# Patient Record
Sex: Female | Born: 1965
Health system: Midwestern US, Community
[De-identification: ages and names within clinical notes are randomized; demographics above are authoritative.]

## PROBLEM LIST (undated history)

## (undated) DIAGNOSIS — I10 Essential (primary) hypertension: Secondary | ICD-10-CM

## (undated) DIAGNOSIS — E119 Type 2 diabetes mellitus without complications: Secondary | ICD-10-CM

## (undated) DIAGNOSIS — M5416 Radiculopathy, lumbar region: Secondary | ICD-10-CM

## (undated) DIAGNOSIS — M797 Fibromyalgia: Secondary | ICD-10-CM

## (undated) HISTORY — DX: Essential (primary) hypertension: I10

## (undated) HISTORY — DX: Type 2 diabetes mellitus without complications: E11.9

---

## 2010-05-04 IMAGING — CR DG CHEST 2V
2 series · 2 of 2 positions shown · non-contrast
Comparison: Report of a prior chest x-ray 04/27/2000

CLINICAL DATA: Cough for

CHEST - 2 VIEW

[w chest pa]
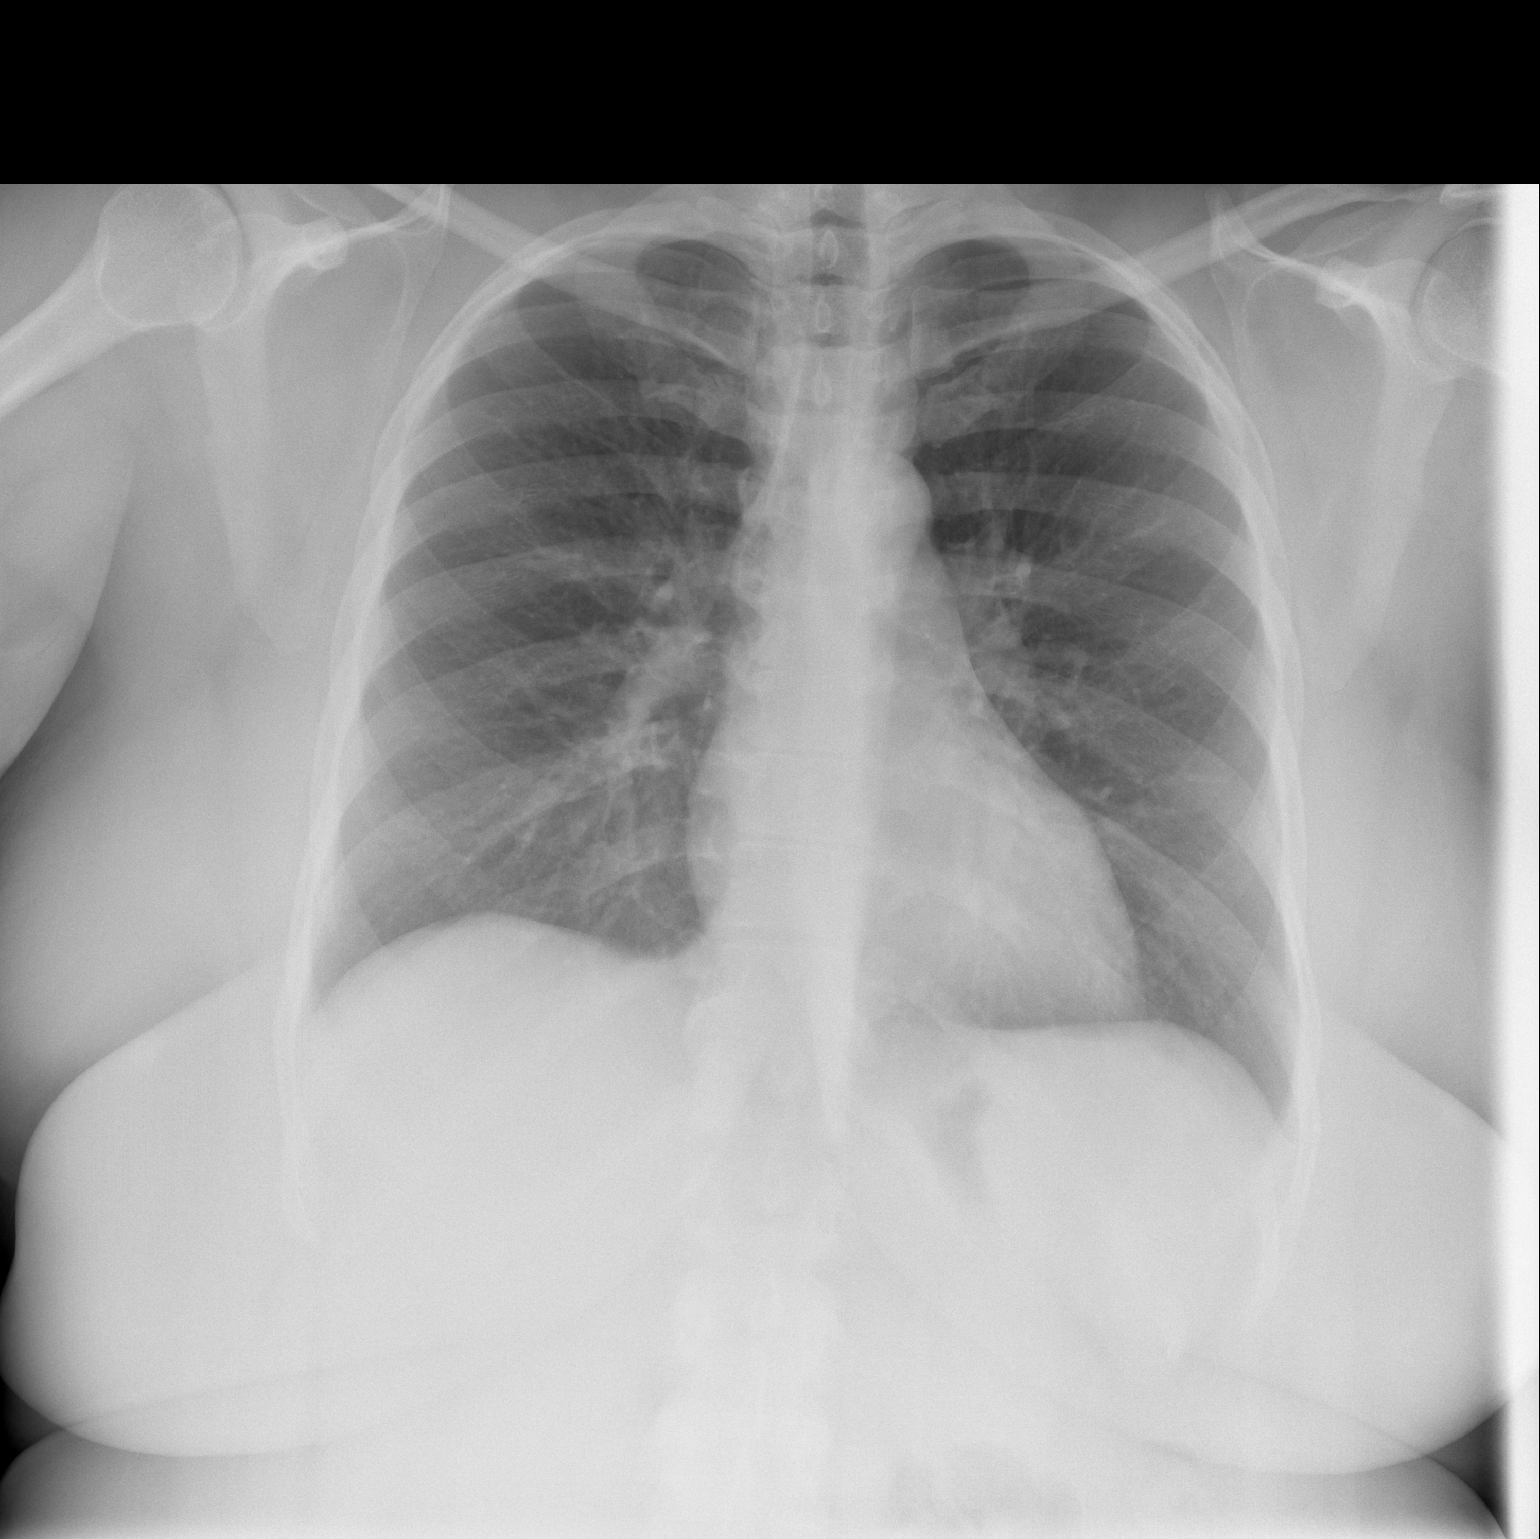

[w chest lat]
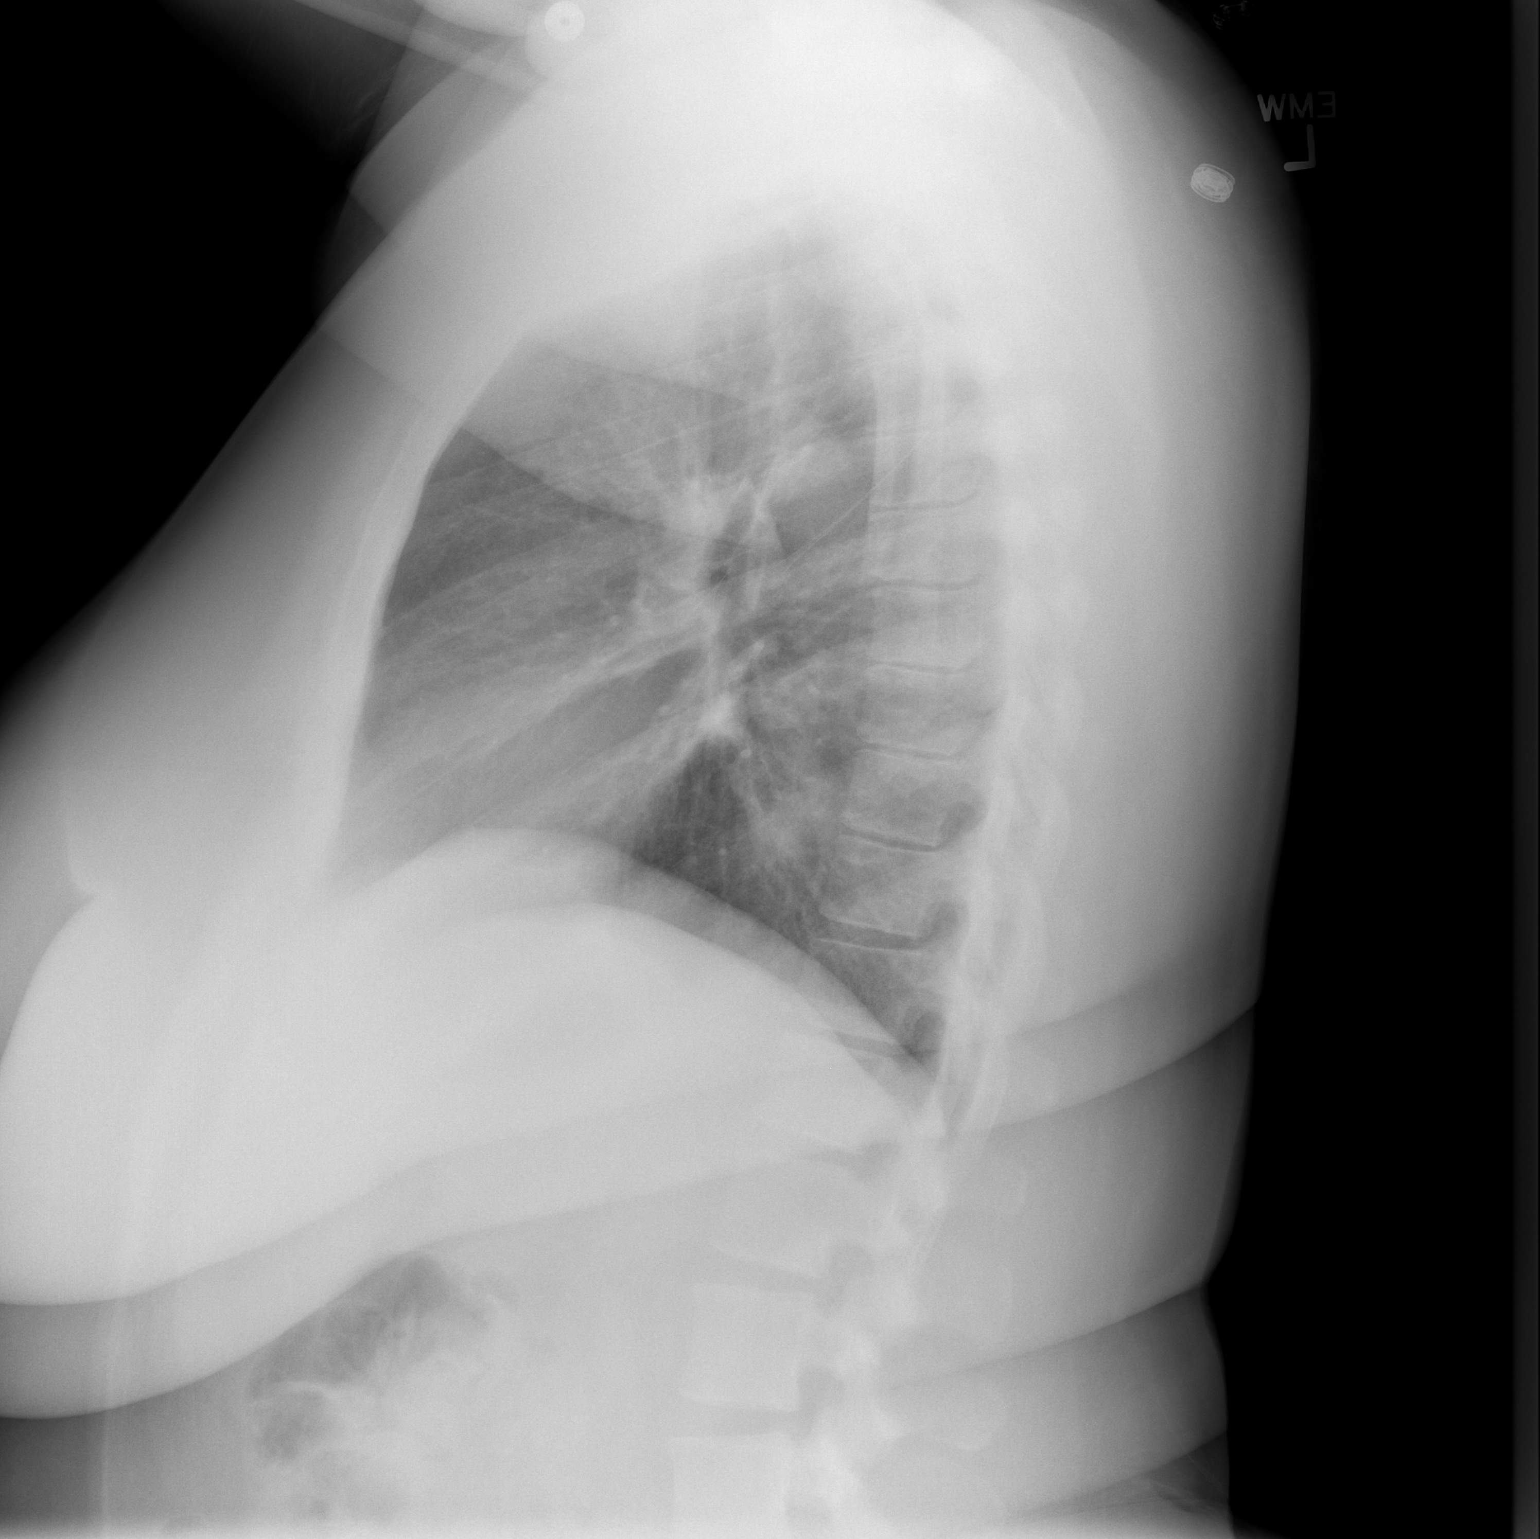

[2 of 2 positions shown; findings below may reference images not displayed]

FINDINGS: Heart and mediastinal contours normal.  Lungs clear.  No
pleural fluid.  Osseous structures and soft tissues unremarkable.
IMPRESSION: No active disease.

## 2018-01-23 ENCOUNTER — Encounter (INDEPENDENT_AMBULATORY_CARE_PROVIDER_SITE_OTHER): Payer: Self-pay | Admitting: Rheumatology

## 2018-01-23 ENCOUNTER — Ambulatory Visit (INDEPENDENT_AMBULATORY_CARE_PROVIDER_SITE_OTHER): Payer: Medicaid Other | Admitting: Rheumatology

## 2018-01-23 VITALS — BP 145/84 | HR 69 | Temp 98.5°F | Resp 15 | Ht 61.0 in | Wt 212.6 lb

## 2018-01-23 DIAGNOSIS — R030 Elevated blood-pressure reading, without diagnosis of hypertension: Secondary | ICD-10-CM

## 2018-01-23 DIAGNOSIS — E1140 Type 2 diabetes mellitus with diabetic neuropathy, unspecified: Secondary | ICD-10-CM

## 2018-01-23 DIAGNOSIS — R768 Other specified abnormal immunological findings in serum: Secondary | ICD-10-CM

## 2018-01-23 DIAGNOSIS — M533 Sacrococcygeal disorders, not elsewhere classified: Secondary | ICD-10-CM

## 2018-01-23 DIAGNOSIS — Z794 Long term (current) use of insulin: Secondary | ICD-10-CM

## 2018-01-23 DIAGNOSIS — M791 Myalgia, unspecified site: Secondary | ICD-10-CM

## 2018-01-23 DIAGNOSIS — G894 Chronic pain syndrome: Secondary | ICD-10-CM

## 2018-01-23 DIAGNOSIS — M1000 Idiopathic gout, unspecified site: Secondary | ICD-10-CM

## 2018-01-23 NOTE — Progress Notes (Signed)
RHEUMATOLOGY NEW PATIENT  NOTE    PCP: Pcp, None, MD    Tiffany Huang is a 52 y.o. female who presents to the rheumatology clinic for initial evaluation of abnl lab results.    Chief Complaint   Patient presents with   . Initial Consult     abn lab results, (R) leg sciatic pain, cortisone shot 12/2017, (L) brusitis,  joint pain, pelvic area, torso and bilateral legs, ambulates upstairs with assistance, sleep disturbance due to pain        +Ana found in the course of work-up of chronic pain. Patient has a 28+ y history of diabetes and has both nerve and possibly eye complications. She has been working with pain mgmt for over 1 year for pain.    Onset - gradual and chronic  Location - bilateral hip(s), bilateral upper leg(s), bilateral ankle(s) and bilateral foot/feet  Duration -  12  months   Character - burning and aching  Severity - 7/10  Alleviating factors - Nothing  Aggravating factors - exercise  Treatments - duloxetine, celebrex, cymbalta    She reports problems with stairs because of weakness.      The following sections were reviewed this encounter by the provider:   Allergies  Meds  Problems  Med Hx  Surg Hx  Fam Hx         PMH/PSH:  Past Medical History:   Diagnosis Date   . Diabetes mellitus    . Hypertension         Past Surgical History:   Procedure Laterality Date   . D & C  1992, 1994   . NASAL SEPTUM SURGERY  2005   . PARTIAL HYSTERECTOMY  01/2002          FH/SH:  Family History   Problem Relation Age of Onset   . Hypertension Mother    . Hypertension Father    . Diabetes Father    . Multiple sclerosis Sister    . Gout Son    . Diabetes Son    . Proteinuria Son    . Sarcoidosis Sister    . Diabetes Sister    . Thrombocytopenia Sister    . Diabetes Sister    . Cancer Sister    . Thyroid disease Sister    . Diabetes Sister    . Polycystic ovary syndrome Sister        Social History     Socioeconomic History   . Marital status: Unknown     Spouse name: Not on file   . Number of children: Not on  file   . Years of education: Not on file   . Highest education level: Not on file   Occupational History   . Not on file   Social Needs   . Financial resource strain: Not on file   . Food insecurity:     Worry: Not on file     Inability: Not on file   . Transportation needs:     Medical: Not on file     Non-medical: Not on file   Tobacco Use   . Smoking status: Never Smoker   . Smokeless tobacco: Never Used   Substance and Sexual Activity   . Alcohol use: Yes     Comment: once a month   . Drug use: Not Currently   . Sexual activity: Not on file   Lifestyle   . Physical activity:     Days per week: Not on  file     Minutes per session: Not on file   . Stress: Not on file   Relationships   . Social connections:     Talks on phone: Not on file     Gets together: Not on file     Attends religious service: Not on file     Active member of club or organization: Not on file     Attends meetings of clubs or organizations: Not on file     Relationship status: Not on file   . Intimate partner violence:     Fear of current or ex partner: Not on file     Emotionally abused: Not on file     Physically abused: Not on file     Forced sexual activity: Not on file   Other Topics Concern   . Not on file   Social History Narrative   . Not on file        Meds/ Allergies:  Outpatient Medications Marked as Taking for the 01/23/18 encounter (Office Visit) with Lawrence Marseilles, MD   Medication Sig Dispense Refill   . amitriptyline (ELAVIL) 25 MG tablet Take 25 mg by mouth nightly     . aspirin 81 MG chewable tablet 1 tablet     . colchicine 0.6 MG tablet TAKE 1 TABLET BY MOUTH ONCE DAILY AS NEEDED FOR 15 DAYS     . escitalopram (LEXAPRO) 20 MG tablet Take 20 mg by mouth daily  2   . indomethacin (INDOCIN) 50 MG capsule TAKE 1 CAPSULE BY MOUTH TWICE DAILY WITH MEALS FOR 15 DAYS     . insulin detemir (LEVEMIR) 100 UNIT/ML injection Inject into the skin     . insulin glargine (LANTUS SOLOSTAR) 100 UNIT/ML injection pen USE AS DIRECTED 10 UNITS  EVERY NIGHT     . Insulin Pen Needle (B-D UF III MINI PEN NEEDLES) 31G X 5 MM Misc USE 1 PEN NEEDLE DAILY     . losartan (COZAAR) 50 MG tablet Take 1 tablet by mouth     . metoprolol succinate (TOPROL-XL) 100 MG 24 hr tablet Take 100 mg by mouth     . pregabalin (LYRICA) 75 MG capsule 1 tab po qAM and 2 tabs qhs     . traZODone (DESYREL) 50 MG tablet Take 50 mg by mouth     . [DISCONTINUED] escitalopram (LEXAPRO) 20 MG tablet        Allergies   Allergen Reactions   . Penicillins        Review of Systems   Constitutional: Positive for malaise/fatigue. Negative for chills, fever and weight loss.   HENT: Negative for hearing loss and tinnitus.    Eyes: Negative for blurred vision and double vision.   Respiratory: Negative for cough and shortness of breath.    Cardiovascular: Negative for chest pain and palpitations.   Gastrointestinal: Negative for heartburn and nausea.   Musculoskeletal: Positive for back pain and myalgias.   Skin: Negative for itching and rash.   Neurological: Positive for tingling and weakness.   Endo/Heme/Allergies: Does not bruise/bleed easily.   Psychiatric/Behavioral: Negative for depression. The patient has insomnia.         PHYSICAL EXAM  Vitals:    01/23/18 1523   BP: 145/84   Pulse: 69   Resp:    Temp:         General- WNWD, alert and oriented, NAD  Eyes- EOMI, PERRL, no conjunctival injection, no scleral icterus  ENMT-ears/nose  symmetric without lesions, OP clear, no oral ulcers  Neck- symmetric, no masses, no thyromegaly  Lymph- no cervical or supraclavicular LAD  CV- nml s1s2 without murmurs, no peripheral edema  Resp- nml effort, CTA bilat, no wheezes or crackles   Skin- no rash, no alopecia, no nail pitting, no tightness of the skin    MSK:   Neck-normal   Shoulders-normal   Elbows-normal  Wrists- normal    Left hand- normal  Right hand-normal     Lumbosacral spine- normal flexion and extension of L Spine. No tenderness. Negative FABER tests.  Hips-normal flexion, internal and external  rotation bilaterally, no trochanteric tenderness. Pain right groin with ROM.  Knees- no crepitus, warmth, swelling or tenderness. Normal ROM.     Strength - shoulders = normal; hips weak hip flexors    LABS:  No results found for: WBC, HGB, HCT, MCV, PLT   No results found for: CREAT, BUN, NA, K, CL, CO2   No results found for: ALT, AST, GGT, ALKPHOS, BILITOTAL   No results found for: ESR   No results found for: CRP   No results found for: ANA   No results found for: RHEUMFACTOR   No results found for: URICACID      RADIOLOGY:    No pertinent radiology results are available for review.      ASSESSMENT/PLAN:    1. Positive ANA (antinuclear antibody)  No specific findings of systemic autoimmune disease that would account for her chronic pain.  - ANA IFA w/rflx to Titer/Pat/Multiplex 11; Future    2. Elevated blood pressure reading  Probable HTN.    3. Pain of right sacroiliac joint  Recent steroid injection. 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    4. Type 2 diabetes mellitus with diabetic neuropathy, with long-term current use of insulin  Contributing factor to chronic pain.    5. Myalgia  Check CK for possibility of myositis. Also could be diabetic muscle disease which commonly strikes the thighs.  - TSH; Future  - Creatine Kinase (CK); Future    6. Chronic pain syndrome  F/up with pain mgmt.    7. Idiopathic gout, unspecified chronicity, unspecified site   SUA 6.1. Continue colchicine.    Return if symptoms worsen or fail to improve.      PATIENT INSTRUCTIONS:  Patient Instructions   Your feedback about the services we provided today in Rheumatology is important to [place].  You will receive a confidential survey via e-mail (check your spam/junk folder) or mail the next Wednesday after today's visit.  Please take a moment to complete it to let us know what we got right and where we can work to improve.    If you haven't already, Please sign up for MyChart.  The messaging feature of MyChart is the best  way to communicate with me and my team to get test results, medication refills, or ask questions.    Here are things I'd like you to do following today's visit:  Please have blood taken for labs to look in more detail about your ANA and the muscle weakness.             Marnee Guarneri, MD, FACP, [facility]  Rheumatologist  [address]  [address]   [place], [place] [address]  P [phone]  F [phone]

## 2018-01-23 NOTE — Patient Instructions (Signed)
Your feedback about the services we provided today in Rheumatology is important to [place].  You will receive a confidential survey via e-mail (check your spam/junk folder) or mail the next Wednesday after today's visit.  Please take a moment to complete it to let us know what we got right and where we can work to improve.    If you haven't already, Please sign up for MyChart.  The messaging feature of MyChart is the best way to communicate with me and my team to get test results, medication refills, or ask questions.    Here are things I'd like you to do following today's visit:  Please have blood taken for labs to look in more detail about your ANA and the muscle weakness.

## 2018-03-13 ENCOUNTER — Encounter (INDEPENDENT_AMBULATORY_CARE_PROVIDER_SITE_OTHER): Payer: Self-pay

## 2018-03-13 ENCOUNTER — Telehealth (INDEPENDENT_AMBULATORY_CARE_PROVIDER_SITE_OTHER): Payer: Self-pay

## 2018-03-13 DIAGNOSIS — R768 Other specified abnormal immunological findings in serum: Secondary | ICD-10-CM

## 2018-03-13 MED ORDER — HYDROXYCHLOROQUINE SULFATE 200 MG PO TABS
200.0000 mg | ORAL_TABLET | Freq: Every day | ORAL | 2 refills | Status: DC
Start: 2018-03-13 — End: 2018-06-07

## 2018-03-13 NOTE — Telephone Encounter (Signed)
Lab results from 03/02/2018 acquired, faxed into epic.

## 2018-03-13 NOTE — Telephone Encounter (Signed)
Called pt back and tried to inform her the lab results. Pt still had questions regarding SSA results. Please call back when you have the chance.    [phone]

## 2018-03-13 NOTE — Telephone Encounter (Signed)
Labs reviewed.  Not concerning of an autoimmune disease.  The ANA is very weakly positive at 1:80.

## 2018-03-13 NOTE — Addendum Note (Signed)
Addended by: Marnee Guarneri on: 03/13/2018 04:47 PM     Modules accepted: Orders

## 2018-03-13 NOTE — Telephone Encounter (Signed)
LVm for pt to CB. RE; lab results

## 2018-03-14 NOTE — Telephone Encounter (Signed)
[facility] order in [place] mail to patient

## 2018-03-22 LAB — IGG, IGA, IGM
IgM: 58 mg/dL (ref 26–217)
Immunoglobulin A: 441 mg/dL — ABNORMAL HIGH (ref 87–352)
Total IgG: 1145 mg/dL (ref 700–1600)

## 2018-03-22 LAB — RHEUMATOID FACTOR: RA Latex Turbid.: 10.3 IU/mL (ref 0.0–13.9)

## 2018-03-22 LAB — C3 COMPLEMENT: C3 Complement: 165 mg/dL (ref 82–167)

## 2018-03-22 LAB — C4 COMPLEMENT: C4 Complement: 47 mg/dL — ABNORMAL HIGH (ref 14–44)

## 2018-03-23 ENCOUNTER — Telehealth (INDEPENDENT_AMBULATORY_CARE_PROVIDER_SITE_OTHER): Payer: Self-pay

## 2018-03-23 NOTE — Telephone Encounter (Signed)
Pt would like Lab results from yesterday  Resulted for your review    Please advise

## 2018-03-24 NOTE — Telephone Encounter (Signed)
Spoke to patient and relayed Lab results; pt did have further questions regarding "high SSA" - advised patient to stay by phone for your call, when you can. She verbalized thanks and understanding.    Thank you    [phone]

## 2018-03-24 NOTE — Telephone Encounter (Signed)
I spoke with pt about her antibodies.  She recalls this being positive 10-20 y ago when she was visiting with a Rheumatologist in [place].  She also recalls a Schirmer test and has been on Sjogren's patient forum and is concerned her symptoms may be related.  I asked her to make a follow-up appointment so we can review and consider additional tests such as a minor salivary gland biopsy.

## 2018-03-24 NOTE — Telephone Encounter (Signed)
I left her a message at [phone]; I asked her to call back.  Summary of lab results  Weak +ANA 1:80, Ro ab 8.0 - seen with Sjogren's syndrome but not diagnostic of this condition.  Complement levels and immunoglobulins are essentially normal.  I don't think these findings account for her chronic MSK pain.  I am happy to discuss with her further.  Thanks  DrK

## 2018-04-11 ENCOUNTER — Ambulatory Visit (INDEPENDENT_AMBULATORY_CARE_PROVIDER_SITE_OTHER): Payer: Medicaid Other | Admitting: Rheumatology

## 2018-04-17 ENCOUNTER — Telehealth (INDEPENDENT_AMBULATORY_CARE_PROVIDER_SITE_OTHER): Payer: Self-pay

## 2018-04-17 NOTE — Telephone Encounter (Signed)
Pt called because she wanted to try low dose Naltrexone. She had heard from her support group and was reading that this could help. Please review    [phone]

## 2018-04-18 NOTE — Telephone Encounter (Signed)
I called the number provided and got pt voicemail.  I left a message; I have no experience with low dose naltrexone.  I recommended she discuss this with her pain mgmt physician/provider.

## 2018-05-10 ENCOUNTER — Ambulatory Visit (INDEPENDENT_AMBULATORY_CARE_PROVIDER_SITE_OTHER): Payer: Medicaid Other | Admitting: Rheumatology

## 2018-05-16 ENCOUNTER — Ambulatory Visit (INDEPENDENT_AMBULATORY_CARE_PROVIDER_SITE_OTHER): Payer: Medicaid Other | Admitting: Rheumatology

## 2018-06-06 ENCOUNTER — Other Ambulatory Visit (INDEPENDENT_AMBULATORY_CARE_PROVIDER_SITE_OTHER): Payer: Self-pay | Admitting: Rheumatology

## 2018-06-06 DIAGNOSIS — R768 Other specified abnormal immunological findings in serum: Secondary | ICD-10-CM

## 2018-10-25 ENCOUNTER — Other Ambulatory Visit (INDEPENDENT_AMBULATORY_CARE_PROVIDER_SITE_OTHER): Payer: Self-pay | Admitting: Rheumatology

## 2018-10-25 DIAGNOSIS — R768 Other specified abnormal immunological findings in serum: Secondary | ICD-10-CM

## 2019-03-08 ENCOUNTER — Encounter

## 2019-03-08 ENCOUNTER — Ambulatory Visit: Admit: 2019-03-08 | Payer: MEDICAID | Attending: Internal Medicine | Primary: Internal Medicine

## 2019-03-08 ENCOUNTER — Ambulatory Visit: Attending: Internal Medicine | Primary: Internal Medicine

## 2019-03-08 DIAGNOSIS — R059 Cough, unspecified: Secondary | ICD-10-CM

## 2019-03-08 DIAGNOSIS — R053 Chronic cough: Secondary | ICD-10-CM

## 2019-03-08 MED ORDER — NALTREXONE HCL (BULK) 100 % POWDER
100 % | Freq: Every day | 3 refills | Status: DC
Start: 2019-03-08 — End: 2019-03-08

## 2019-03-08 MED ORDER — NALTREXONE HCL (BULK) 100 % POWDER
100 % | Freq: Every day | 3 refills | Status: DC
Start: 2019-03-08 — End: 2019-04-13

## 2019-03-08 NOTE — Patient Instructions (Addendum)
1. 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     2. Physical therapy for the low back and thighs.    3. Labs from [facility] at your convenience.    4. Xray from any [facility] location.    5. Start low dose naltrexone 4.5mg  daily from [facility].    6. Talk to your pain doctor about a trial of milnacipran for fibromyalgia pain.    7. Return in 6-8 weeks

## 2019-03-08 NOTE — Progress Notes (Signed)
Chief Complaint   Patient presents with   ??? Fibromyalgia     HANDS, THIGH, ABD   ??? Arthritis

## 2019-03-08 NOTE — Progress Notes (Signed)
REASON FOR VISIT:   Ms.Bastos is a 54 y.o. female with history of  who is self-referred to [place], regarding a diagnosis of joint pain.    HISTORY OF PRESENT ILLNESS    For years, had dry eyes, recalls a Schirmer test being abnormal and told she had antibodies of Sjogren's syndrome. She previously had a positive ANA screen. Saw Dr. Lanny Hurst in December 2019, workup s/f ANA 1:80, SSA+ 8.0, negative rheumatoid factor. Had punctal plugs placed couple months ago which have helped. Visine hasn't been helpful, started another OTC lubricant which been more helpful. In the 1990's she wnet to the ophthalmologist while in [place], diagnosed with infection related to dry eyes. Hasn't used any prescription medications.    Frequent dry mouth. Cold sores but no oral ulcers. Hasn't been to the dentist since 2018, but no current oral discomfort.    Had one episode of Bell's palsy maybe on the left, no brain imaging at the time and resolved spontaneously.    Has been out of work since 2018 2/2 chronic pain.    Right low back (?SI) injection helped for sciatica, but now the left side is is weak and painful, has to manually lift her left thigh .      Left lateral thigh is worst. Feels she is being stuck with a needle in various parts of the body, sharp pains on the torso, under the foot, etc.    Takes ibuprofen $RemoveBeforeD'800mg'[id number]$  and tylenol, when severe takes $RemoveBefo'1600mg'xjeSQKGyvQM$ . Celebrex made her more sluggish, concerned for the risk of weight gain with that.    Postprandial bloating, frequent GI upset. Tends toward constipation. No h/o dark or bloody stools. Taking a lot of gaviscon and alka seltzer for dyspepsia.    Getting pruritis, "goose bumps," improved after a steroid taper though spiked blood sugar. Advised to avoid hot showers.     Sleep is poor. Goes to bed at [place], wakes at [place]. Drenching night sweats about once a week.     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 A1c she thinks over a year ago was in the 9's, from 13 in the past. Painful numbness distal to ankles bilaterally which keeps her up with layig down at night. Lyrica was helping but she was concerned with withdrawal side effects she experienced when she reduced the dose so took herself off of this.      Continues to deal with hypertension despite 2 drugs, cardiology visit is pending later this month.    No photodistributed rashes.    No oral ulcers. She has recently started to avoid gluten with some improvement in diabetes and GI upset.     She has a mild cough she attributes to longstanding sinus congestion, particularly bothersome at night. Used to be seasonal and limited to Fall, but has persisted this year.    REVIEW OF SYSTEMS  A comprehensive review of systems was negative except for that written in the HPI.  A 10-point review of systems is per the new patient questionnaire, which has been reviewed extensively and scanned into the electronic medical record for future reference.  Review of systems is as above and is otherwise negative.    ALLERGIES  Penicillins    MEDICATIONS  Current Outpatient Medications   Medication Sig   ??? metoprolol tartrate (LOPRESSOR) 100 mg IR tablet 100 mg daily.   ??? losartan (COZAAR) 50 mg tablet Take 50 mg by mouth daily.   ??? insulin glargine (Lantus  U-100 Insulin) 100 unit/mL injection by SubCUTAneous route nightly.   ??? semaglutide (OZEMPIC SC) by SubCUTAneous route.   ??? aspirin (ASPIRIN) 325 mg tablet Take 325 mg by mouth daily.   ??? venlafaxine-SR (Effexor XR) 150 mg capsule Take 150 mg by mouth daily.   ??? rosuvastatin (Crestor) 10 mg tablet Take 10 mg by mouth nightly.     No current facility-administered medications for this visit.        PAST MEDICAL HISTORY  Past Medical History:   Diagnosis Date   ??? Diabetes ([place])        FAMILY HISTORY   family history includes MS in her sister. 4 sisters and 1 brother. Sister has sarcoidosis and thrombocytopenia. Another sister with MS and oral ulcers.    SOCIAL HISTORY  She  reports that she has never smoked. She has never used smokeless tobacco. She reports current alcohol use. She reports current drug use. Drug: Marijuana.  Social History     Social History Narrative   ??? Not on file       DATA  Visit Vitals  BP (!) 167/90 (BP 1 Location: Left arm, BP Patient Position: Sitting)   Pulse 83   Temp 97.5 ??F (36.4 ??C) (Oral)   Resp 20   Ht '5\' 2"'$  (1.575 m)   Wt 233 lb 9.6 oz (106 kg)   SpO2 96%   BMI 42.73 kg/m??    Body mass index is 42.73 kg/m??. No flowsheet data found.    General:  The patient is pleasant, morbidly obese, alert, and in no apparent distress.   Eyes: Sclera are anicteric. Decreased tear meniscus bilaterally with trace conjunctival injection.   HEENT:  Oropharynx is clear. No oral ulcers. Decreased salivary pooling without glossitis or flissuring. No cervical or supraclavicular lymphadenopathy.   Lungs:  Clear to auscultation bilaterally, without wheeze or stridor. Normal respiratory effort.   Cor:  Regular rate and rhythm.  No murmur rub or gallop.   Abdomen: Soft, non-tender, without hepatomegaly or masses.   Extremities: No calf tenderness or edema. Warm and well perfused.   Skin:  No significant abnormalities.   Neuro: +Right-sided SLR raise, no muscle wasting.   Musculoskeletal:    A comprehensive musculoskeletal exam was performed for all joints of each upper and lower extremity and assessed for swelling, tenderness and range of motion. Results are documented as below:  Pan-positive fibromyalgia tenderpoints.  Negative steeple sign in hands  Marked tenderness over pes anserine bursa in left knee.  No evidence of synovitis in the small joints of the hands, wrists, shoulders, elbows, hips, knees or ankles.     Labs:  03/21/18 RF neg; IgA 441 (high), IgG 1145, C3 WNL, C4 high   03/02/18: CMP WNL, CBC WNL, ANA IFA 1:80 speckled, SSA >8.0, SSB neg, negative centromere B, RNP, chromatin, dsDNA, Sm antibodies  12/01/17: CMP WNL, CBC WNL    Imaging:  09/04/18 (report only) XR L hand, L foot: No acute fracture or dislocation  06/08/18 XR Lspine (report only): Well-maintained disc heights. ??No evidence of any acute pathology. ??AP and   lateral alignments are normal.    ASSESSMENT AND PLAN  Ms. Sahlin is a 54 y.o. female who presents for evaluation of mixed-character joint pain and fatigue on a background of SSA+ sicca complex consistent with Sjogren's syndrome, fibromyalgia, and sciatica.    1. Sjogren's syndrome with other organ involvement ([place])  - XR CHEST PA LAT; Future  - REFERRAL TO PHYSICAL THERAPY  -  VITAMIN B12; Future  - SPEP AND IFE, SERUM; Future  - C REACTIVE PROTEIN, QT; Future  - CBC WITH AUTOMATED DIFF; Future  - METABOLIC PANEL, COMPREHENSIVE; Future  - SED RATE (ESR); Future  - Naltrexone HCL, Bulk, 100 % powd; 4.5 mg by Does Not Apply route daily.  Dispense: 0.135 g; Refill: 3    2. Chronic cough  - XR CHEST PA LAT; Future    3. Fibromyalgia  - REFERRAL TO PHYSICAL THERAPY  - Naltrexone HCL, Bulk, 100 % powd; 4.5 mg by Does Not Apply route daily.  Dispense: 0.135 g; Refill: 3   -Fibromyalgia is a disease characterized by chronic widespread musculoskeletal pain. Fibromyalgia is caused by abnormal processing of pain signals in the central nervous system, leading to exaggerated pain responses. Non-pharmacologic therapies such as cardiovascular exercise and Cognitive Behavioral Therapy have been shown to be of benefit ([place], Am J Med 2009). Tai Chi in particular has proven efficacy in the treatment of fibromyalgia Talbert Cage, NEJM 2010).  If pharmacotherapy is pursued, pregabalin (Lyrica), gabapentin (Neurontin), milnacipran (Savella), and duloxetine (Cymbalta) are FDA approved medications for the treatment of fibromyalgia. Narcotics have not been proven to be efficacious in the treatment of fibromyalgia. In fact, narcotic use in this patient population has been observed to exacerbate depression, and may enhance the hyperalgesia which is characteristic of this condition Roselie Awkward, Arth Rheum 2006). They also are at increased risk for opioid-induced hyperalgesia due predominantly to central sensitization Sharyon Cable JT et al. Lenna Sciara Clin Rheumatol. 2013 Mar;19(2):72-7). Specifically, a double-blind placebo-controlled trial by Annie Sable al published in 1995 demonstrated that intravenous morphine did not reduce pain in fibromyalgia patients. A study by Ivy Lynn al published in 2003 showed that fibromyalgia patients taking oral opiates did not experience improvement in their pain at four years of follow up, and also reported increased depression over the last two years of the study. There is subsequent concern that the prolonged use of narcotics to treat fibromyalgia may cause harm to these patients Edison Pace, Pain 2005). Opioid use in fibromyalgia had poorer symptoms and functional and occupational status compared to nonusers (Fitzcharles MA et al. Pain Res Treat. 3875;6433:295188).  We therefore recommend that narcotics be avoided in all patients with fibromyalgia. Furthermore, naltrexone 4.'5mg'$  daily has been  shown to improve daily pain scores in fibromyalgia in a randomized, controlled clinical trial (Arthritis Rheum. 2013 Feb;65(2):529-38); this can be prescribed through a compounding pharmacy and is a consideration for patients not already dependent on opiate-type medications. For all patients, we recommend Tai Chi stretching exercises for at least 30 minutes per day. The Arthritis Foundation has made a videotape of [place] that she can borrow from [employer], purchase online or watch for free on [email or web address] ???Tai Chi for Arthritis???.   We discussed treating secondary causes, such as sleep apnea, poor sleep quality, depression, anxiety, weight loss, vitamin deficiencies, such as vitamin D, and pursuing aquatherapy. I encouraged her to do [place].   My recommendations were provided to her and she was informed to follow up with her primary care physician for further management of her fibromyalgia.     4. Diabetic polyneuropathy associated with type 2 diabetes mellitus ([place])  - VITAMIN B12; Future    5. Meralgia paresthetica of left side  - REFERRAL TO PHYSICAL THERAPY  - XR HIPS BI W PELV 3 OR 4 VWS; Future    6. Pes anserine bursitis  - REFERRAL TO PHYSICAL THERAPY    7. Primary osteoarthritis of both  hips  - XR HIPS BI W PELV 3 OR 4 VWS; Future    8. Lumbar facet arthropathy  - XR SPINE LUMB MIN 4 V; Future        Orders Placed This Encounter   ??? metoprolol tartrate (LOPRESSOR) 100 mg IR tablet   ??? losartan (COZAAR) 50 mg tablet   ??? insulin glargine (Lantus U-100 Insulin) 100 unit/mL injection   ??? semaglutide (OZEMPIC SC)   ??? aspirin (ASPIRIN) 325 mg tablet   ??? venlafaxine-SR (Effexor XR) 150 mg capsule   ??? rosuvastatin (Crestor) 10 mg tablet     Patient Instructions   1. 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     2. Physical therapy for the low back and thighs.    3. Labs from [place] at your convenience.     4. Xray from any [facility] location.    5. Start low dose naltrexone 4.'5mg'$  daily from [facility].    6. Talk to your pain doctor about a trial of milnacipran for fibromyalgia pain.    7. Return in 6-8 weeks      Medications: I am having Yatziry Deakins maintain her metoprolol tartrate, losartan, insulin glargine, semaglutide (OZEMPIC SC), aspirin, venlafaxine-SR, rosuvastatin, and Naltrexone HCL (Bulk).    Follow up: 6-8 weeks  Burton Apley, MD    Adult Rheumatology   [facility]  A Part of [facility]  [address] [place] [address]   Phone [phone]  Fax [phone]

## 2019-03-08 NOTE — Progress Notes (Signed)
REASON FOR VISIT:   Ms.Amaker is a 54 y.o. female with history of  who is self-referred to [place], regarding a diagnosis of joint pain.    HISTORY OF PRESENT ILLNESS    For years, had dry eyes, recalls a Schirmer test being abnormal and told she had antibodies of Sjogren's syndrome. She previously had a positive ANA screen. Saw Dr. Lanny Hurst in December 2019, workup s/f ANA 1:80, SSA+ 8.0, negative rheumatoid factor. Had punctal plugs placed couple months ago which have helped. Visine hasn't been helpful, started another OTC lubricant which been more helpful. In the 1990's she wnet to the ophthalmologist while in [place], diagnosed with infection related to dry eyes. Hasn't used any prescription medications.    Frequent dry mouth. Cold sores but no oral ulcers. Hasn't been to the dentist since 2018, but no current oral discomfort.    Had one episode of Bell's palsy maybe on the left, no brain imaging at the time and resolved spontaneously.    Has been out of work since 2018 2/2 chronic pain.    Right low back (?SI) injection helped for sciatica, but now the left side is is weak and painful, has to manually lift her left thigh .      Left lateral thigh is worst. Feels she is being stuck with a needle in various parts of the body, sharp pains on the torso, under the foot, etc.    Takes ibuprofen 884m and tylenol, when severe takes 16046m Celebrex made her more sluggish, concerned for the risk of weight gain with that.    Postprandial bloating, frequent GI upset. Tends toward constipation. No h/o dark or bloody stools. Taking a lot of gaviscon and alka seltzer for dyspepsia.    Getting pruritis, "goose bumps," improved after a steroid taper though spiked blood sugar. Advised to avoid hot showers.     Sleep is poor. [place] to bed at [place] night sweats about once a week.    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 A1c she thinks over a  year ago was in the 9's, from 13 in the past. Painful numbness distal to ankles bilaterally which keeps her up with layig down at night. Lyrica was helping but she was concerned with withdrawal side effects she experienced when she reduced the dose so took herself off of this.      Continues to deal with hypertension despite 2 drugs, cardiology visit is pending later this month.    No photodistributed rashes.    No oral ulcers. She has recently started to avoid gluten with some improvement in diabetes and GI upset.     She has a mild cough she attributes to longstanding sinus congestion, particularly bothersome at night. Used to be seasonal and limited to Fall, but has persisted this year.    REVIEW OF SYSTEMS  A comprehensive review of systems was negative except for that written in the HPI.  A 10-point review of systems is per the new patient questionnaire, which has been reviewed extensively and scanned into the electronic medical record for future reference.  Review of systems is as above and is otherwise negative.    ALLERGIES  Penicillins    MEDICATIONS  Current Outpatient Medications   Medication Sig   ??? metoprolol tartrate (LOPRESSOR) 100 mg IR tablet 100 mg daily.   ??? losartan (COZAAR) 50 mg tablet Take 50 mg by mouth daily.   ??? insulin glargine (Lantus  U-100 Insulin) 100 unit/mL injection by SubCUTAneous route nightly.   ??? semaglutide (OZEMPIC SC) by SubCUTAneous route.   ??? aspirin (ASPIRIN) 325 mg tablet Take 325 mg by mouth daily.   ??? venlafaxine-SR (Effexor XR) 150 mg capsule Take 150 mg by mouth daily.   ??? rosuvastatin (Crestor) 10 mg tablet Take 10 mg by mouth nightly.     No current facility-administered medications for this visit.        PAST MEDICAL HISTORY  Past Medical History:   Diagnosis Date   ??? Diabetes ([place])        FAMILY HISTORY  family history includes MS in her sister. 4 sisters and 1 brother. Sister has sarcoidosis and thrombocytopenia. Another sister with MS and oral ulcers.    SOCIAL  HISTORY  She  reports that she has never smoked. She has never used smokeless tobacco. She reports current alcohol use. She reports current drug use. Drug: Marijuana.  Social History     Social History Narrative   ??? Not on file       DATA  Visit Vitals  BP (!) 167/90 (BP 1 Location: Left arm, BP Patient Position: Sitting)   Pulse 83   Temp 97.5 ??F (36.4 ??C) (Oral)   Resp 20   Ht 5' 2"  (1.575 m)   Wt 233 lb 9.6 oz (106 kg)   SpO2 96%   BMI 42.73 kg/m??    Body mass index is 42.73 kg/m??. No flowsheet data found.    General:  The patient is pleasant, morbidly obese, alert, and in no apparent distress.   Eyes: Sclera are anicteric. Decreased tear meniscus bilaterally with trace conjunctival injection.   HEENT:  Oropharynx is clear. No oral ulcers. Decreased salivary pooling without glossitis or flissuring. No cervical or supraclavicular lymphadenopathy.   Lungs:  Clear to auscultation bilaterally, without wheeze or stridor. Normal respiratory effort.   Cor:  Regular rate and rhythm.  No murmur rub or gallop.   Abdomen: Soft, non-tender, without hepatomegaly or masses.   Extremities: No calf tenderness or edema. Warm and well perfused.   Skin:  No significant abnormalities.   Neuro: +Right-sided SLR raise, no muscle wasting.   Musculoskeletal:    A comprehensive musculoskeletal exam was performed for all joints of each upper and lower extremity and assessed for swelling, tenderness and range of motion. Results are documented as below:  Pan-positive fibromyalgia tenderpoints.  Negative steeple sign in hands  Marked tenderness over pes anserine bursa in left knee.  No evidence of synovitis in the small joints of the hands, wrists, shoulders, elbows, hips, knees or ankles.     Labs:  03/21/18 RF neg; IgA 441 (high), IgG 1145, C3 WNL, C4 high  03/02/18: CMP WNL, CBC WNL, ANA IFA 1:80 speckled, SSA >8.0, SSB neg, negative centromere B, RNP, chromatin, dsDNA, Sm antibodies  12/01/17: CMP WNL, CBC WNL    Imaging:  09/04/18 (report  only) XR L hand, L foot: No acute fracture or dislocation  06/08/18 XR Lspine (report only): Well-maintained disc heights. ??No evidence of any acute pathology. ??AP and   lateral alignments are normal.    ASSESSMENT AND PLAN  Ms. Halls is a 54 y.o. female who presents for evaluation of mixed-character joint pain and fatigue on a background of SSA+ sicca complex consistent with Sjogren's syndrome, fibromyalgia, and sciatica.    1. Sjogren's syndrome with other organ involvement ([place])  - XR CHEST PA LAT; Future  - REFERRAL TO PHYSICAL THERAPY  -  VITAMIN B12; Future  - SPEP AND IFE, SERUM; Future  - C REACTIVE PROTEIN, QT; Future  - CBC WITH AUTOMATED DIFF; Future  - METABOLIC PANEL, COMPREHENSIVE; Future  - SED RATE (ESR); Future  - Naltrexone HCL, Bulk, 100 % powd; 4.5 mg by Does Not Apply route daily.  Dispense: 0.135 g; Refill: 3    2. Chronic cough  - XR CHEST PA LAT; Future    3. Fibromyalgia  - REFERRAL TO PHYSICAL THERAPY  - Naltrexone HCL, Bulk, 100 % powd; 4.5 mg by Does Not Apply route daily.  Dispense: 0.135 g; Refill: 3  -Fibromyalgia is a disease characterized by chronic widespread musculoskeletal pain. Fibromyalgia is caused by abnormal processing of pain signals in the central nervous system, leading to exaggerated pain responses. Non-pharmacologic therapies such as cardiovascular exercise and Cognitive Behavioral Therapy have been shown to be of benefit ([place], Am J Med 2009). Tai Chi in particular has proven efficacy in the treatment of fibromyalgia Talbert Cage, NEJM 2010).  If pharmacotherapy is pursued, pregabalin (Lyrica), gabapentin (Neurontin), milnacipran (Savella), and duloxetine (Cymbalta) are FDA approved medications for the treatment of fibromyalgia. Narcotics have not been proven to be efficacious in the treatment of fibromyalgia. In fact, narcotic use in this patient population has been observed to exacerbate depression, and may enhance the hyperalgesia which is characteristic of this condition  Roselie Awkward, Arth Rheum 2006). They also are at increased risk for opioid-induced hyperalgesia due predominantly to central sensitization Sharyon Cable JT et al. Lenna Sciara Clin Rheumatol. 2013 Mar;19(2):72-7). Specifically, a double-blind placebo-controlled trial by Annie Sable al published in 1995 demonstrated that intravenous morphine did not reduce pain in fibromyalgia patients. A study by Ivy Lynn al published in 2003 showed that fibromyalgia patients taking oral opiates did not experience improvement in their pain at four years of follow up, and also reported increased depression over the last two years of the study. There is subsequent concern that the prolonged use of narcotics to treat fibromyalgia may cause harm to these patients Edison Pace, Pain 2005). Opioid use in fibromyalgia had poorer symptoms and functional and occupational status compared to nonusers (Fitzcharles MA et al. Pain Res Treat. 1610;9604:540981).  We therefore recommend that narcotics be avoided in all patients with fibromyalgia. Furthermore, naltrexone 4.56m daily has been shown to improve daily pain scores in fibromyalgia in a randomized, controlled clinical trial (Arthritis Rheum. 2013 Feb;65(2):529-38); this can be prescribed through a compounding pharmacy and is a consideration for patients not already dependent on opiate-type medications. For all patients, we recommend Tai Chi stretching exercises for at least 30 minutes per day. The Arthritis Foundation has made a videotape of TSigurdthat she can borrow from [employer] purchase online or watch for free on [email or web address]???Tai Chi for Arthritis???.   We discussed treating secondary causes, such as sleep apnea, poor sleep quality, depression, anxiety, weight loss, vitamin deficiencies, such as vitamin D, and pursuing aquatherapy. I encouraged [place]   My recommendations were provided to her and she was informed to follow up with her primary care physician for further management of her  fibromyalgia.     4. Diabetic polyneuropathy associated with type 2 diabetes mellitus ([place]  - VITAMIN B12; Future    5. Meralgia paresthetica of left side  - REFERRAL TO PHYSICAL THERAPY  - XR HIPS BI W PELV 3 OR 4 VWS; Future    6. Pes anserine bursitis  - REFERRAL TO PHYSICAL THERAPY    7. Primary osteoarthritis of both  hips  - XR HIPS BI W PELV 3 OR 4 VWS; Future    8. Lumbar facet arthropathy  - XR SPINE LUMB MIN 4 V; Future        Orders Placed This Encounter   ??? metoprolol tartrate (LOPRESSOR) 100 mg IR tablet   ??? losartan (COZAAR) 50 mg tablet   ??? insulin glargine (Lantus U-100 Insulin) 100 unit/mL injection   ??? semaglutide (OZEMPIC SC)   ??? aspirin (ASPIRIN) 325 mg tablet   ??? venlafaxine-SR (Effexor XR) 150 mg capsule   ??? rosuvastatin (Crestor) 10 mg tablet     Patient Instructions   1. 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     2. Physical therapy for the low back and thighs.    3. Labs from [place] at your convenience.    4. Xray from any [facility] location.    5. Start low dose naltrexone 4.7m daily from [facility].    6. Talk to your pain doctor about a trial of milnacipran for fibromyalgia pain.    7. Return in 6-8 weeks      Medications: I am having KHarsimran Westmanmaintain her metoprolol tartrate, losartan, insulin glargine, semaglutide (OZEMPIC SC), aspirin, venlafaxine-SR, rosuvastatin, and Naltrexone HCL (Bulk).    Follow up: 6-8 weeks  CBurton Apley MD    Adult Rheumatology   [facility] [address] [place] [address]  Phone [phone] Fax [phone]

## 2019-03-08 NOTE — Progress Notes (Signed)
 Chief Complaint   Patient presents with   . Fibromyalgia     HANDS, THIGH, ABD   . Arthritis

## 2019-04-10 NOTE — Telephone Encounter (Signed)
Patient is calling due to the Naltrexone is not covered under her insurance for the Compound Pharm[facility]. She will need this not in a compound form, but a regular RX  sent to [facility], P: [phone], on  [address], [place], [place],. Her phone is [phone].

## 2019-04-11 NOTE — Telephone Encounter (Signed)
Used 2 identifiers. Patient given Dr. Cooper Render message regarding Naltrexone. Patient understood, and will look into another Compounding Pharmacy.

## 2019-04-11 NOTE — Telephone Encounter (Signed)
This is a medication used for fibromyalgia. I always warn patients that insurance will not cover it, though it's usually not more than $15-20 per month out of pocket. The compounding pharmacy is necessary because the dose is usually 80mg , and instead patients take 4.5mg  daily for chronic pain conditions.   I had warned the patient insurance wouldn't cover it. If it's not affordable for her, she will either need to find a more affordable compounding pharmacy or go with another treatment option for fibromyalgia through her PCP.    Message text

## 2019-04-13 ENCOUNTER — Encounter

## 2019-04-13 MED ORDER — NALTREXONE HCL (BULK) 100 % POWDER
100 % | Freq: Every day | 3 refills | Status: AC
Start: 2019-04-13 — End: ?

## 2019-04-13 NOTE — Telephone Encounter (Signed)
-----   Message from Lennox Pippins sent at 04/13/2019  3:09 PM EST -----  Regarding: Dr Nyra Capes  Pt is calling regarding Low dose Naltrexone, said the pharmacy that it was call to cost $50 for 30,day supply,  she found a pharmacy on line for $46.95 for a 90 day supply, so she would like to get it call into the [facility] at [facility] [phone], fax number [phone], pt can be reach at [phone]

## 2019-04-16 LAB — CBC WITH AUTO DIFFERENTIAL
Basophils %: 0 %
Basophils Absolute: 0 10*3/uL (ref 0.0–0.2)
Eosinophils %: 2 %
Eosinophils Absolute: 0.1 10*3/uL (ref 0.0–0.4)
Granulocyte Absolute Count: 0 10*3/uL (ref 0.0–0.1)
Hematocrit: 42 % (ref 34.0–46.6)
Hemoglobin: 13.5 g/dL (ref 11.1–15.9)
Immature Granulocytes %: 0 %
Lymphocytes %: 35 %
Lymphocytes Absolute: 1.8 10*3/uL (ref 0.7–3.1)
MCH: 27.5 pg (ref 26.6–33.0)
MCHC: 32.1 g/dL (ref 31.5–35.7)
MCV: 86 fL (ref 79–97)
Monocytes %: 7 %
Monocytes Absolute: 0.4 10*3/uL (ref 0.1–0.9)
Neutrophils %: 56 %
Neutrophils Absolute: 2.9 10*3/uL (ref 1.4–7.0)
Platelets: 370 10*3/uL (ref 150–450)
RBC: 4.91 x10E6/uL (ref 3.77–5.28)
RDW: 14.2 % (ref 11.7–15.4)
WBC: 5.1 10*3/uL (ref 3.4–10.8)

## 2019-04-16 LAB — COMPREHENSIVE METABOLIC PANEL
ALT: 23 IU/L (ref 0–32)
AST: 25 IU/L (ref 0–40)
Albumin/Globulin Ratio: 1.2 NA (ref 1.2–2.2)
Albumin: 3.8 g/dL (ref 3.8–4.9)
Alkaline Phosphatase: 103 IU/L (ref 39–117)
BUN/Creatinine Ratio: 17 NA (ref 9–23)
BUN: 21 mg/dL (ref 6–24)
CO2: 26 mmol/L (ref 20–29)
Calcium: 9.5 mg/dL (ref 8.7–10.2)
Chloride: 99 mmol/L (ref 96–106)
Creatinine: 1.25 mg/dL — ABNORMAL HIGH (ref 0.57–1.00)
GFR African American: 56 mL/min/{1.73_m2} — ABNORMAL LOW (ref >59)
Globulin, Total: 3.2 g/dL (ref 1.5–4.5)
Glucose: 126 mg/dL — ABNORMAL HIGH (ref 65–99)
Potassium: 4.9 mmol/L (ref 3.5–5.2)
Sodium: 140 mmol/L (ref 134–144)
Total Bilirubin: 0.4 mg/dL (ref 0.0–1.2)
Total Protein: 7 g/dL (ref 6.0–8.5)
eGFR NON-AA: 49 mL/min/{1.73_m2} — ABNORMAL LOW (ref >59)

## 2019-04-16 LAB — SPEP AND IFE, SERUM
A/G RATIO, 149531: 1 NA (ref 0.7–1.7)
A/G ratio: 1 (ref 0.7–1.7)
Albumin: 3.4 g/dL (ref 2.9–4.4)
Albumin: 3.4 g/dL (ref 2.9–4.4)
Alpha-1-Globulin: 0.2 g/dL (ref 0.0–0.4)
Alpha-1-globulin: 0.2 g/dL (ref 0.0–0.4)
Alpha-2-Globulin: 0.9 g/dL (ref 0.4–1.0)
Alpha-2-Globulin: 0.9 g/dL (ref 0.4–1.0)
Beta Globulin: 1.5 g/dL — ABNORMAL HIGH (ref 0.7–1.3)
Beta Globulin: 1.5 g/dL — ABNORMAL HIGH (ref 0.7–1.3)
Gamma Globulin: 1 g/dL (ref 0.4–1.8)
Gamma Globulin: 1 g/dL (ref 0.4–1.8)
Globulin, Total: 3.6 g/dL (ref 2.2–3.9)
Globulin, total: 3.6 g/dL (ref 2.2–3.9)
Immunoglobulin A, Qt.: 441 mg/dL — ABNORMAL HIGH (ref 87–352)
Immunoglobulin A, Qt.: 441 mg/dL — ABNORMAL HIGH (ref 87–352)
Immunoglobulin G, Qt.: 1203 mg/dL (ref 586–1602)
Immunoglobulin G, Quantitative: 1203 mg/dL (ref 586–1602)
Immunoglobulin M, Qt.: 57 mg/dL (ref 26–217)
Immunoglobulin M, Quantitative: 57 mg/dL (ref 26–217)

## 2019-04-16 LAB — VITAMIN B12
Vitamin B-12: 935 pg/mL (ref 232–1245)
Vitamin B12: 935 pg/mL (ref 232–1245)

## 2019-04-16 LAB — C-REACTIVE PROTEIN: CRP: 6 mg/L (ref 0–10)

## 2019-04-16 LAB — SEDIMENTATION RATE: Sed Rate, Automated: 16 mm/h (ref 0–40)

## 2019-04-16 LAB — METABOLIC PANEL, COMPREHENSIVE
A-G Ratio: 1.2 (ref 1.2–2.2)
ALT (SGPT): 23 IU/L (ref 0–32)
AST (SGOT): 25 IU/L (ref 0–40)
Albumin: 3.8 g/dL (ref 3.8–4.9)
Alk. phosphatase: 103 IU/L (ref 39–117)
BUN/Creatinine ratio: 17 (ref 9–23)
BUN: 21 mg/dL (ref 6–24)
Bilirubin, total: 0.4 mg/dL (ref 0.0–1.2)
CO2: 26 mmol/L (ref 20–29)
Calcium: 9.5 mg/dL (ref 8.7–10.2)
Chloride: 99 mmol/L (ref 96–106)
Creatinine: 1.25 mg/dL — ABNORMAL HIGH (ref 0.57–1.00)
GFR est AA: 56 mL/min/{1.73_m2} — ABNORMAL LOW (ref >59)
GFR est non-AA: 49 mL/min/{1.73_m2} — ABNORMAL LOW (ref >59)
GLOBULIN, TOTAL: 3.2 g/dL (ref 1.5–4.5)
Glucose: 126 mg/dL — ABNORMAL HIGH (ref 65–99)
Potassium: 4.9 mmol/L (ref 3.5–5.2)
Protein, total: 7 g/dL (ref 6.0–8.5)
Sodium: 140 mmol/L (ref 134–144)

## 2019-04-16 LAB — CBC WITH AUTOMATED DIFF
ABS. BASOPHILS: 0 10*3/uL (ref 0.0–0.2)
ABS. EOSINOPHILS: 0.1 10*3/uL (ref 0.0–0.4)
ABS. IMM. GRANS.: 0 10*3/uL (ref 0.0–0.1)
ABS. MONOCYTES: 0.4 10*3/uL (ref 0.1–0.9)
ABS. NEUTROPHILS: 2.9 10*3/uL (ref 1.4–7.0)
Abs Lymphocytes: 1.8 10*3/uL (ref 0.7–3.1)
BASOPHILS: 0 %
EOSINOPHILS: 2 %
HCT: 42 % (ref 34.0–46.6)
HGB: 13.5 g/dL (ref 11.1–15.9)
IMMATURE GRANULOCYTES: 0 %
Lymphocytes: 35 %
MCH: 27.5 pg (ref 26.6–33.0)
MCHC: 32.1 g/dL (ref 31.5–35.7)
MCV: 86 fL (ref 79–97)
MONOCYTES: 7 %
NEUTROPHILS: 56 %
PLATELET: 370 10*3/uL (ref 150–450)
RBC: 4.91 x10E6/uL (ref 3.77–5.28)
RDW: 14.2 % (ref 11.7–15.4)
WBC: 5.1 10*3/uL (ref 3.4–10.8)

## 2019-04-16 LAB — SED RATE (ESR): Sed rate (ESR): 16 mm/hr (ref 0–40)

## 2019-04-16 LAB — C REACTIVE PROTEIN, QT: C-Reactive Protein, Qt: 6 mg/L (ref 0–10)

## 2019-04-19 ENCOUNTER — Telehealth: Admit: 2019-04-19 | Payer: MEDICAID | Attending: Internal Medicine | Primary: Internal Medicine

## 2019-04-19 ENCOUNTER — Telehealth: Attending: Internal Medicine | Primary: Internal Medicine

## 2019-04-19 DIAGNOSIS — M3500 Sicca syndrome, unspecified: Secondary | ICD-10-CM

## 2019-04-19 NOTE — Patient Instructions (Addendum)
1. Hold off on NSAIDs like ibuprofen and naproxen--if you need medications for pain, stick with tylenol (up to 3000mg /day in divided doses is OK) and pain medications like Lyrica.    2. Start the LDN once you get this.    3. I'm starting you on pilocarpine, though wait for at least 2 weeks after you start the LDN so you know which drug is causing any problems you might run into.    4. Keep working with PT on the low back.    5. Return in 3 months

## 2019-04-19 NOTE — Progress Notes (Signed)
REASON FOR VISIT:   Heather Skinner is a 54 y.o. female with history of Bell's palsy and sciatica presenting for video telehealth followup of SSA+ Sjogren's syndrome.      HISTORY OF PRESENT ILLNESS    Having more gastric upset. Working on prior [place] for Nexium, not currently taking anything for this.    Left low back still hurts, starting PT this week. Therapist suggested she look into a nutritionist referral    Found an online pharmacy where she can get LDN compounded, hasn't yet received it.    Plans to see a psychologist to help her with CBT to improve med adherence.     Dry eyes are similar. Overdue for diabetic retinopathy laser treatments.    For dry mouth, keeps water nearby when drinking. No dental issues, hasn't been to dentist.    Dry skin.    Restarted Lyrica yesterday, which has been a big help for dyesthesias in feet. Bit more drowsy than usual with this.    Interested in hormone replacement therapy. S/p hysterectomy years ago, has developed new night sweats and hot flashes in the interim.      Disease History:  For years, had dry eyes, recalls a Schirmer test being abnormal and told she had antibodies of Sjogren's syndrome. She previously had a positive ANA screen. Saw Dr. Lanny Hurst in December 2019, workup s/f ANA 1:80, SSA+ 8.0, negative rheumatoid factor. Had punctal plugs placed couple months ago which have helped. Visine hasn't been helpful, st+arted another OTC lubricant which been more helpful. In the 1990's she wnet to the ophthalmologist while in [place], diagnosed with infection related to dry eyes. Hasn't used any prescription medications.  Frequent dry mouth. Cold sores but no oral ulcers. Hasn't been to the dentist since 2018, but no current oral discomfort.  Had one episode of Bell's palsy maybe on the left, no brain imaging at the time and resolved spontaneously.  Has been out of work since 2018 2/2 chronic pain.  Right low back (?SI) injection helped for sciatica, but now the left side is is weak  and painful, has to manually lift her left thigh .    Takes ibuprofen 800mg  and tylenol, when severe takes 1600mg . Celebrex made her more sluggish, concerned for the risk of weight gain with that.  Postprandial bloating, frequent GI upset. Tends toward constipation. No h/o dark or bloody stools. Taking a lot of gaviscon and alka seltzer for dyspepsia.  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 A1c she thinks over a year ago was in the 9's, from 13 in the past. Painful numbness distal to ankles bilaterally which keeps her up with laying down at night. Lyrica was helping but she was concerned with withdrawal side effects she experienced when she reduced the dose so took herself off of this.     REVIEW OF SYSTEMS  A comprehensive review of systems was negative except for that written in the HPI.  A 10-point review of systems is per the new patient questionnaire, which has been reviewed extensively and scanned into the electronic medical record for future reference.  Review of systems is as above and is otherwise negative.    ALLERGIES  Penicillins    MEDICATIONS  Current Outpatient Medications   Medication Sig   ??? Naltrexone HCL, Bulk, 100 % powd 4.5 mg by Does Not Apply route daily.   ??? metoprolol tartrate (LOPRESSOR) 100 mg IR tablet 100 mg daily.   ??? losartan (COZAAR) 50 mg  tablet Take 50 mg by mouth daily.   ??? insulin glargine (Lantus U-100 Insulin) 100 unit/mL injection by SubCUTAneous route nightly.   ??? semaglutide (OZEMPIC SC) by SubCUTAneous route.   ??? aspirin (ASPIRIN) 325 mg tablet Take 325 mg by mouth daily.   ??? venlafaxine-SR (Effexor XR) 150 mg capsule Take 150 mg by mouth daily.   ??? rosuvastatin (Crestor) 10 mg tablet Take 10 mg by mouth nightly.     No current facility-administered medications for this visit.        PAST MEDICAL HISTORY  Past Medical History:   Diagnosis Date   ??? Diabetes (HCC)        FAMILY HISTORY  family history includes MS in her  sister. 4 sisters and 1 brother. Sister has sarcoidosis and thrombocytopenia. Another sister with MS and oral ulcers.    SOCIAL HISTORY  She  reports that she has never smoked. She has never used smokeless tobacco. She reports current alcohol use. She reports current drug use. Drug: Marijuana.  Social History     Social History Narrative   ??? Not on file       DATA  No vitals for virtual telehealth visit.    General:  The patient is well developed, well nourished, alert, and in no apparent distress.   Eyes: Sclera are anicteric. Decreased tear meniscus, stable trace conjunctival injection.   HEENT:  Oropharynx is clear. No obvious oral ulcers. Adequate salivary pooling.    Lungs:  Speaking in full sentences, no stridor or retractions  Cor:  No peripheral edema or cyanosis  Abdomen: No distention or guarding.   Skin: No petechiae or purpura. No photodistributed rashes.  Neuro: Nonfocal, no foot or wrist drop. Normal gait.  Musculoskeletal:    No frank swelling of hands or LE's    Labs:  04/11/19: WBC 5.1, Hgb 13.5, Plt 370; Cr 1.25; LFTs WNL; B12 935, CRP 6mg/L, SPEP with polyclonal increase detected in one or more immunoglobulins.  03/21/18 RF neg; IgA 441 (high), IgG 1145, C3 WNL, C4 high  03/02/18: CMP WNL, CBC WNL, ANA IFA 1:80 speckled, SSA >8.0, SSB neg, negative centromere B, RNP, chromatin, dsDNA, Sm antibodies  12/01/17: CMP WNL, CBC WNL    Imaging:  09/04/18 (report only) XR L hand, L foot: No acute fracture or dislocation  06/08/18 XR Lspine (report only): Well-maintained disc heights. ??No evidence of any acute pathology. ??AP and   lateral alignments are normal.    ASSESSMENT AND PLAN  Ms. Papadakis is a 53 y.o. female who presents for followup of mixed-character joint pain and fatigue on a background of SSA+ sicca complex consistent with Sjogren's syndrome, fibromyalgia, and sciatica. For sicca complex, starting pilocarpine tid. Continuing with PT for sciatica, working with PCP on pain control related to  fibromyalgia.    1. Sjogren's syndrome, with unspecified organ involvement (HCC)  - pilocarpine (SALAGEN) 5 mg tablet; Take 1 Tab by mouth three (3) times daily.  Dispense: 90 Tab; Refill: 3  -Annual SPEP with IFE    2. Lumbar facet arthropathy  -Continue PT  -Reminded patient to pursue xrays  -Cautioned pt re: NSAID avoidance for mild AKI  -Tylenol up to 3000mg/day in divided doses OK    3. Fibromyalgia  -Prescribed low dose naltrexone given difficulty obtaining this outside of subspecialty centers  -Rest per PCP      Orders Placed This Encounter   ??? pilocarpine (SALAGEN) 5 mg tablet     Patient Instructions   1.   Hold off on NSAIDs like ibuprofen and naproxen--if you need medications for pain, stick with tylenol (up to 3000mg /day in divided doses is OK) and pain medications like Lyrica.    2. Start the LDN once you get this.    3. I'm starting you on pilocarpine, though wait for at least 2 weeks after you start the LDN so you know which drug is causing any problems you might run into.    4. Keep working with PT on the low back.    5. Return in 3 months      Medications: I am having start on pilocarpine. I am also having her maintain her metoprolol tartrate, losartan, insulin glargine, semaglutide (OZEMPIC SC), aspirin, venlafaxine-SR, rosuvastatin, and Naltrexone HCL (Bulk).    Follow up: 3 months    Pursuant to the emergency declaration under the [facility] Act and the [facility], 1135 waiver authority and the [employer] and [employer] Act, this Virtual  Visit was conducted, with patient's consent, to reduce the patient's risk of exposure to COVID-19 and provide continuity of care for an established patient.     Services were provided through a video synchronous discussion virtually to substitute for in-person clinic visit.    Face to face time: 30 minutes  Note preparation and records review day of service: minutes 8  Total provider time day of  service: 38 minutes      [employer], MD    Adult Rheumatology   [facility]  [facility] of [facility]  [address], [place], [place]   Phone [phone]  Fax [phone]

## 2019-04-19 NOTE — Progress Notes (Signed)
REASON FOR VISIT:   Heather Skinner is a 54 y.o. female with history of Bell's palsy and sciatica presenting for video telehealth followup of SSA+ Sjogren's syndrome.      HISTORY OF PRESENT ILLNESS    Having more gastric upset. Working on prior [place] for Nexium, not currently taking anything for this.    Left low back still hurts, starting PT this week. Therapist suggested she look into a nutritionist referral    Found an online pharmacy where she can get LDN compounded, hasn't yet received it.    Plans to see a psychologist to help Heather Skinner with CBT to improve med adherence.     Dry eyes are similar. Overdue for diabetic retinopathy laser treatments.    For dry mouth, keeps water nearby when drinking. No dental issues, hasn't been to dentist.    Dry skin.    Restarted Lyrica yesterday, which has been a big help for dyesthesias in feet. Bit more drowsy than usual with this.    Interested in hormone replacement therapy. S/p hysterectomy years ago, has developed new night sweats and hot flashes in the interim.      Disease History:  For years, had dry eyes, recalls a Schirmer test being abnormal and told she had antibodies of Sjogren's syndrome. She previously had a positive ANA screen. Saw Dr. Lanny Hurst in December 2019, workup s/f ANA 1:80, SSA+ 8.0, negative rheumatoid factor. Had punctal plugs placed couple months ago which have helped. Visine hasn't been helpful, st+arted another OTC lubricant which been more helpful. In the 1990's she wnet to the ophthalmologist while in [place], diagnosed with infection related to dry eyes. Hasn't used any prescription medications.  Frequent dry mouth. Cold sores but no oral ulcers. Hasn't been to the dentist since 2018, but no current oral discomfort.  Had one episode of Bell's palsy maybe on the left, no brain imaging at the time and resolved spontaneously.  Has been out of work since 2018 2/2 chronic pain.  Right low back (?SI) injection helped for sciatica, but now the left side is is weak  and painful, has to manually lift Heather Skinner left thigh .    Takes ibuprofen 800mg  and tylenol, when severe takes 1600mg . Celebrex made Heather Skinner more sluggish, concerned for the risk of weight gain with that.  Postprandial bloating, frequent GI upset. Tends toward constipation. No h/o dark or bloody stools. Taking a lot of gaviscon and alka seltzer for dyspepsia.  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 A1c she thinks over a year ago was in the 9's, from 13 in the past. Painful numbness distal to ankles bilaterally which keeps Heather Skinner up with laying down at night. Lyrica was helping but she was concerned with withdrawal side effects she experienced when she reduced the dose so took herself off of this.     REVIEW OF SYSTEMS  A comprehensive review of systems was negative except for that written in the HPI.  A 10-point review of systems is per the new patient questionnaire, which has been reviewed extensively and scanned into the electronic medical record for future reference.  Review of systems is as above and is otherwise negative.    ALLERGIES  Penicillins    MEDICATIONS  Current Outpatient Medications   Medication Sig   ??? Naltrexone HCL, Bulk, 100 % powd 4.5 mg by Does Not Apply route daily.   ??? metoprolol tartrate (LOPRESSOR) 100 mg IR tablet 100 mg daily.   ??? losartan (COZAAR) 50 mg  tablet Take 50 mg by mouth daily.   ??? insulin glargine (Lantus U-100 Insulin) 100 unit/mL injection by SubCUTAneous route nightly.   ??? semaglutide (OZEMPIC SC) by SubCUTAneous route.   ??? aspirin (ASPIRIN) 325 mg tablet Take 325 mg by mouth daily.   ??? venlafaxine-SR (Effexor XR) 150 mg capsule Take 150 mg by mouth daily.   ??? rosuvastatin (Crestor) 10 mg tablet Take 10 mg by mouth nightly.     No current facility-administered medications for this visit.        PAST MEDICAL HISTORY  Past Medical History:   Diagnosis Date   ??? Diabetes (HCC)        FAMILY HISTORY  family history includes MS in Heather Skinner  sister. 4 sisters and 1 brother. Sister has sarcoidosis and thrombocytopenia. Another sister with MS and oral ulcers.    SOCIAL HISTORY  She  reports that she has never smoked. She has never used smokeless tobacco. She reports current alcohol use. She reports current drug use. Drug: Marijuana.  Social History     Social History Narrative   ??? Not on file       DATA  No vitals for virtual telehealth visit.    General:  The patient is well developed, well nourished, alert, and in no apparent distress.   Eyes: Sclera are anicteric. Decreased tear meniscus, stable trace conjunctival injection.   HEENT:  Oropharynx is clear. No obvious oral ulcers. Adequate salivary pooling.    Lungs:  Speaking in full sentences, no stridor or retractions  Cor:  No peripheral edema or cyanosis  Abdomen: No distention or guarding.   Skin: No petechiae or purpura. No photodistributed rashes.  Neuro: Nonfocal, no foot or wrist drop. Normal gait.  Musculoskeletal:    No frank swelling of hands or LE's    Labs:  04/11/19: WBC 5.1, Hgb 13.5, Plt 370; Cr 1.25; LFTs WNL; B12 935, CRP 6mg /L, SPEP with polyclonal increase detected in one or more immunoglobulins.  03/21/18 RF neg; IgA 441 (high), IgG 1145, C3 WNL, C4 high  03/02/18: CMP WNL, CBC WNL, ANA IFA 1:80 speckled, SSA >8.0, SSB neg, negative centromere B, RNP, chromatin, dsDNA, Sm antibodies  12/01/17: CMP WNL, CBC WNL    Imaging:  09/04/18 (report only) XR L hand, L foot: No acute fracture or dislocation  06/08/18 XR Lspine (report only): Well-maintained disc heights. ??No evidence of any acute pathology. ??AP and   lateral alignments are normal.    ASSESSMENT AND PLAN  Heather Skinner is a 54 y.o. female who presents for followup of mixed-character joint pain and fatigue on a background of SSA+ sicca complex consistent with Sjogren's syndrome, fibromyalgia, and sciatica. For sicca complex, starting pilocarpine tid. Continuing with PT for sciatica, working with PCP on pain control related to  fibromyalgia.    1. Sjogren's syndrome, with unspecified organ involvement (HCC)  - pilocarpine (SALAGEN) 5 mg tablet; Take 1 Tab by mouth three (3) times daily.  Dispense: 90 Tab; Refill: 3  -Annual SPEP with IFE    2. Lumbar facet arthropathy  -Continue PT  -Reminded patient to pursue xrays  -Cautioned pt re: NSAID avoidance for mild AKI  -Tylenol up to 3000mg /day in divided doses OK    3. Fibromyalgia  -Prescribed low dose naltrexone given difficulty obtaining this outside of subspecialty centers  -Rest per PCP      Orders Placed This Encounter   ??? pilocarpine (SALAGEN) 5 mg tablet     Patient Instructions   1.  Hold off on NSAIDs like ibuprofen and naproxen--if you need medications for pain, stick with tylenol (up to 3000mg /day in divided doses is OK) and pain medications like Lyrica.    2. Start the LDN once you get this.    3. I'm starting you on pilocarpine, though wait for at least 2 weeks after you start the LDN so you know which drug is causing any problems you might run into.    4. Keep working with PT on the low back.    5. Return in 3 months      Medications: I am having start on pilocarpine. I am also having Heather Skinner maintain Heather Skinner metoprolol tartrate, losartan, insulin glargine, semaglutide (OZEMPIC SC), aspirin, venlafaxine-SR, rosuvastatin, and Naltrexone HCL (Bulk).    Follow up: 3 months    Pursuant to the emergency declaration under the [facility] Act and the [facility], 1135 waiver authority and the [employer] and [employer] Act, this Virtual  Visit was conducted, with patient's consent, to reduce the patient's risk of exposure to COVID-19 and provide continuity of care for an established patient.     Services were provided through a video synchronous discussion virtually to substitute for in-person clinic visit.    Face to face time: 30 minutes  Note preparation and records review day of service: minutes 8  Total provider time day of  service: 38 minutes      [employer], MD    Adult Rheumatology   [facility]  [facility]  [address], [place], [place]   Phone [phone]  Fax [phone]

## 2019-04-27 MED ORDER — PILOCARPINE 5 MG TAB
5 mg | ORAL_TABLET | Freq: Three times a day (TID) | ORAL | 3 refills | Status: AC
Start: 2019-04-27 — End: ?
